# Patient Record
Sex: Male | Born: 1960 | Race: White | Hispanic: No | Marital: Married | Smoking: Never smoker
Health system: Southern US, Community
[De-identification: ages and names within clinical notes are randomized; demographics above are authoritative.]

## PROBLEM LIST (undated history)

## (undated) DIAGNOSIS — E119 Type 2 diabetes mellitus without complications: Secondary | ICD-10-CM

## (undated) DIAGNOSIS — Z8739 Personal history of other diseases of the musculoskeletal system and connective tissue: Secondary | ICD-10-CM

## (undated) DIAGNOSIS — T8859XA Other complications of anesthesia, initial encounter: Secondary | ICD-10-CM

## (undated) DIAGNOSIS — Z9889 Other specified postprocedural states: Secondary | ICD-10-CM

## (undated) DIAGNOSIS — I10 Essential (primary) hypertension: Secondary | ICD-10-CM

## (undated) DIAGNOSIS — T4145XA Adverse effect of unspecified anesthetic, initial encounter: Secondary | ICD-10-CM

## (undated) DIAGNOSIS — R112 Nausea with vomiting, unspecified: Secondary | ICD-10-CM

## (undated) HISTORY — PX: ACHILLES TENDON REPAIR: SUR1153

---

## 2012-05-06 ENCOUNTER — Other Ambulatory Visit (HOSPITAL_COMMUNITY): Payer: Self-pay | Admitting: Family Medicine

## 2012-05-06 ENCOUNTER — Ambulatory Visit (HOSPITAL_COMMUNITY)
Admission: RE | Admit: 2012-05-06 | Discharge: 2012-05-06 | Disposition: A | Discharge status: Home / Self Care | Payer: BC Managed Care – PPO | Source: Ambulatory Visit | Attending: Family Medicine | Admitting: Family Medicine

## 2012-05-06 DIAGNOSIS — R109 Unspecified abdominal pain: Secondary | ICD-10-CM | POA: Insufficient documentation

## 2012-05-06 DIAGNOSIS — R1032 Left lower quadrant pain: Secondary | ICD-10-CM

## 2014-02-25 IMAGING — CR DG ABDOMEN 1V
2 series · 2 of 2 positions shown · non-contrast
Comparison: None.

CLINICAL DATA: Abdominal pain for 1 week.

ABDOMEN - 1 VIEW

[view not recorded (1 of 2)]
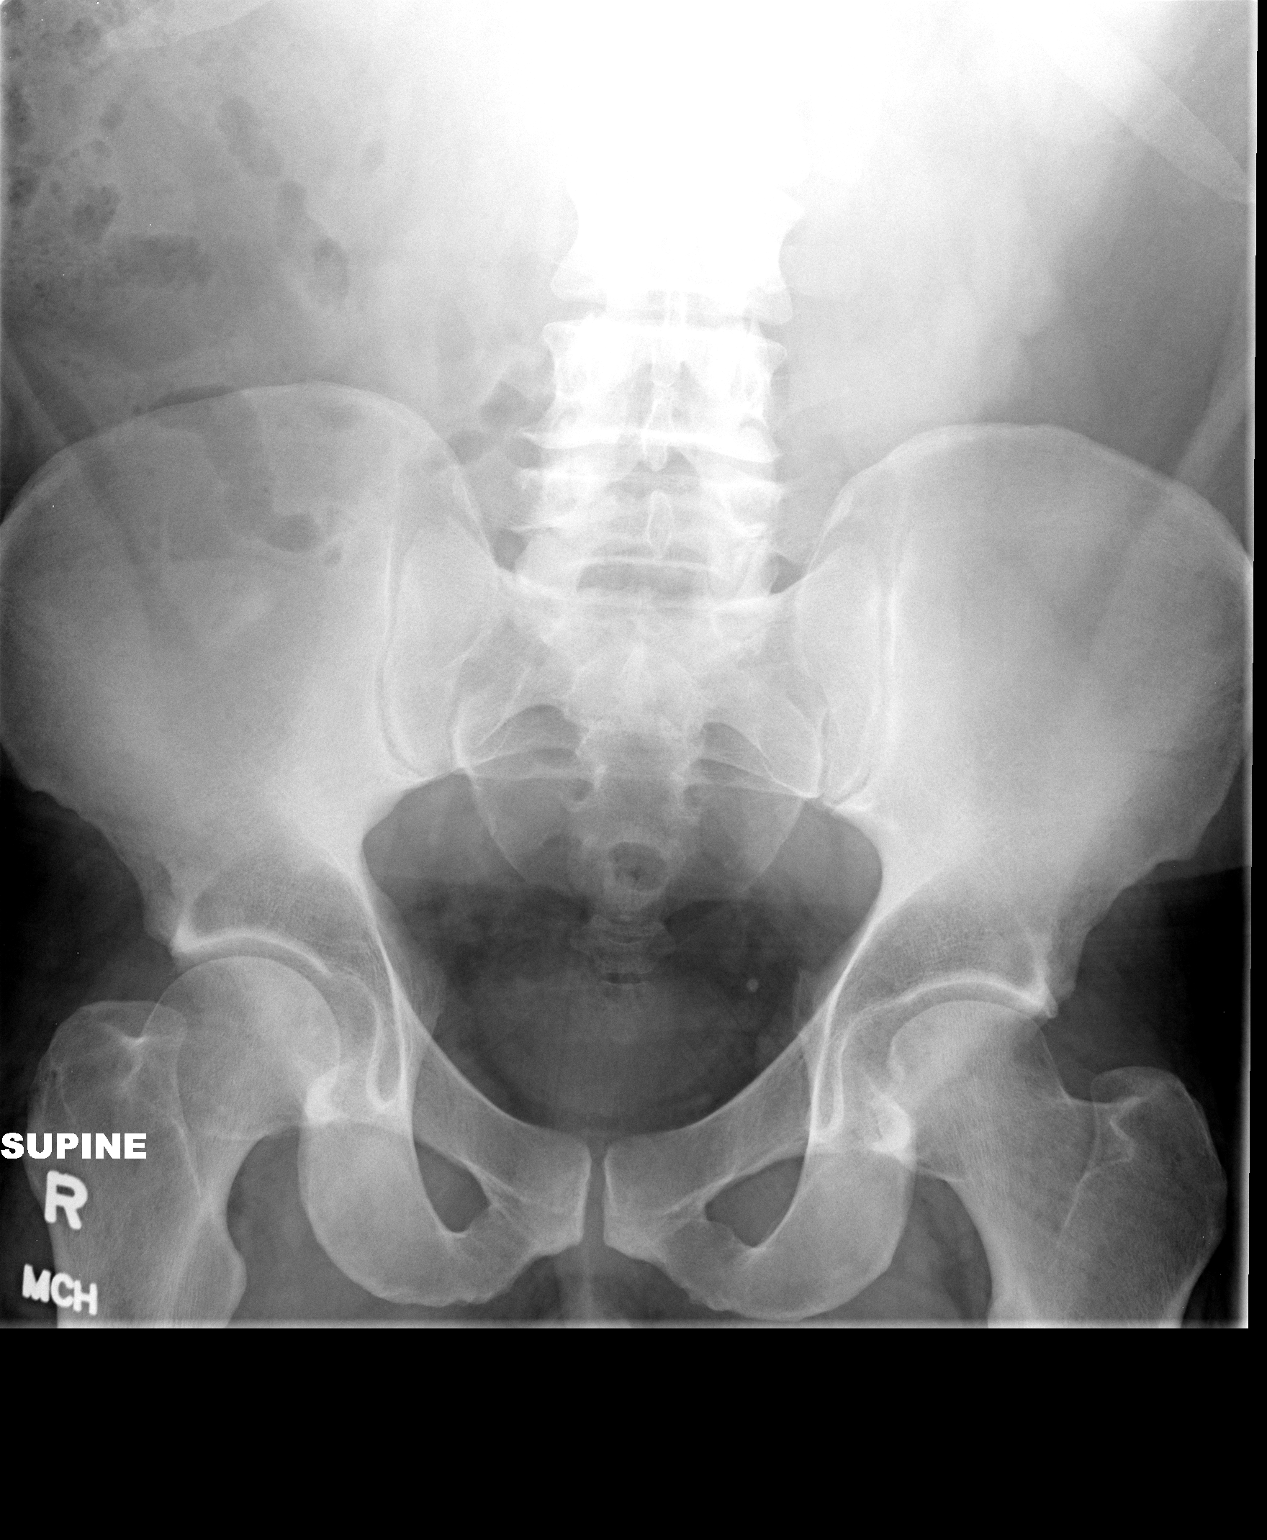

[view not recorded (2 of 2)]
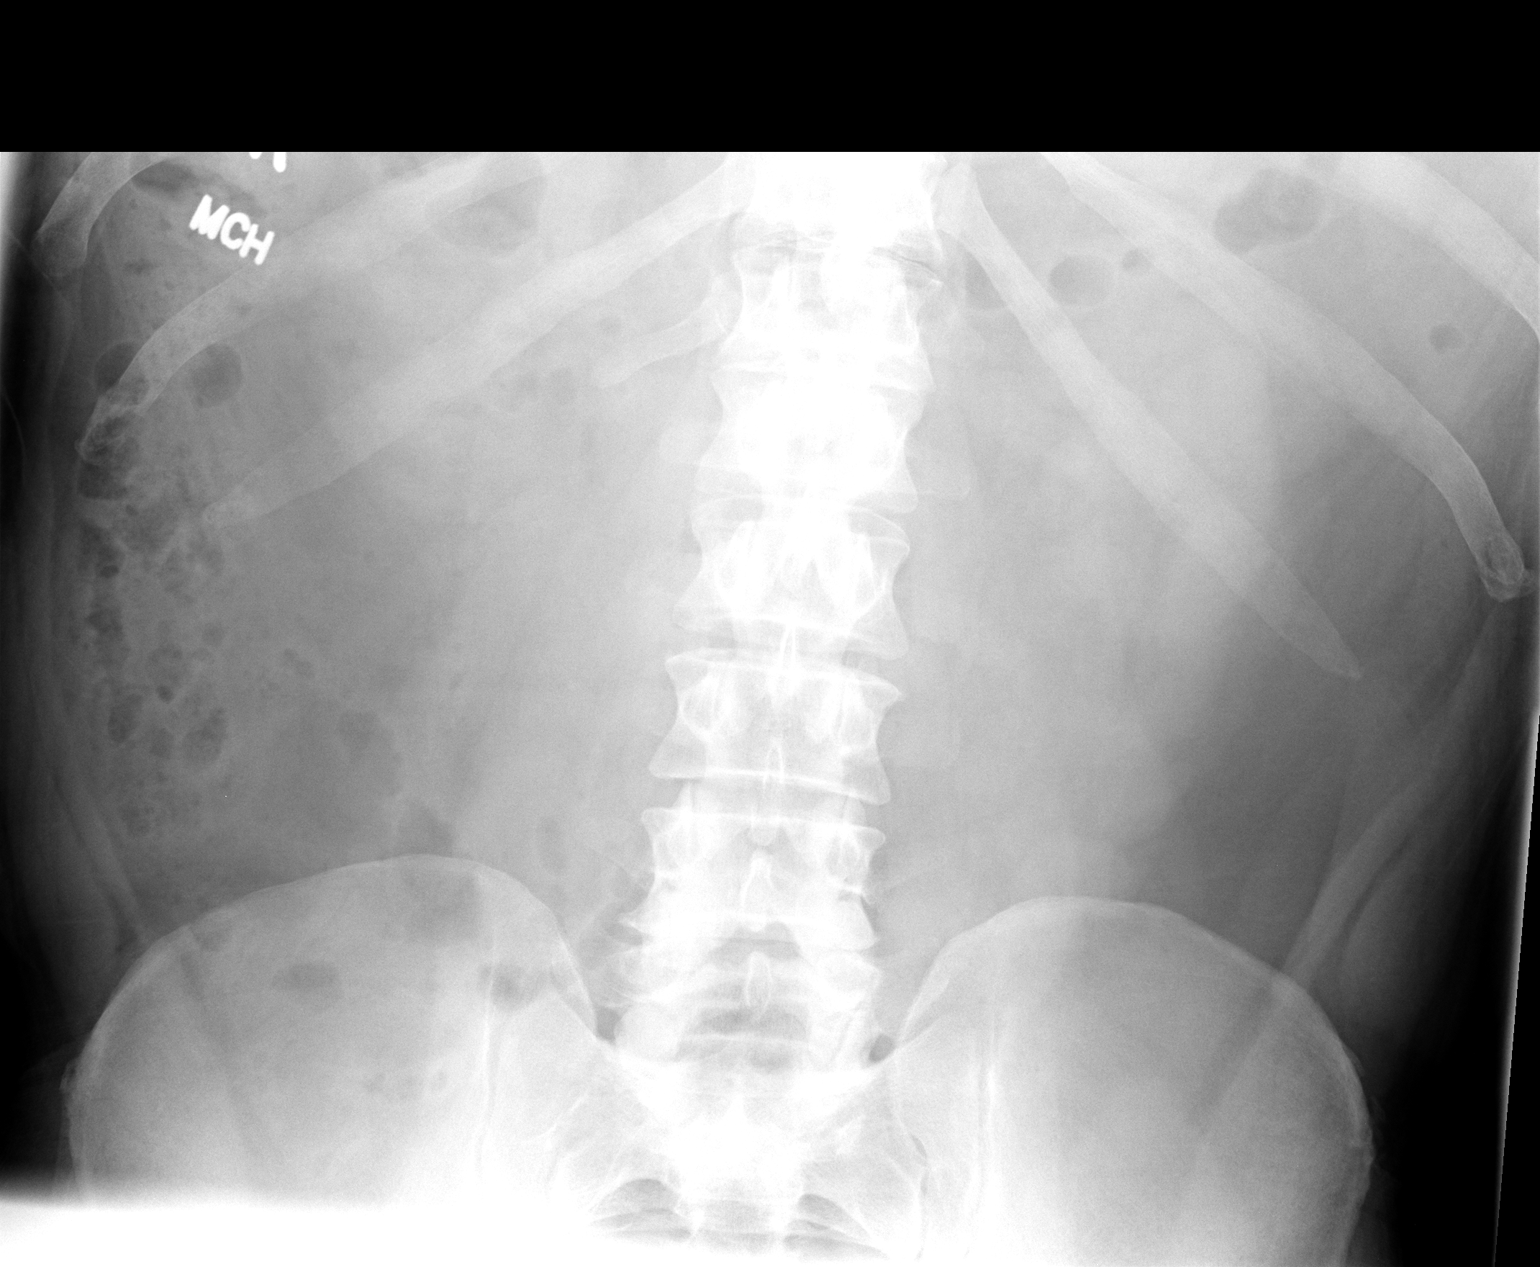

[2 of 2 positions shown; findings below may reference images not displayed]

FINDINGS: 2 supine views.  No gaseous distention of bowel loops.
Left hemipelvic probable phlebolith. No abnormal abdominal
calcifications.   No appendicolith.

No pneumatosis or free intraperitoneal air.
IMPRESSION: No acute findings.

## 2014-11-06 NOTE — H&P (Signed)
  NTS SOAP Note  Vital Signs:  Vitals as of: 10/29/2014: Systolic 176: Diastolic 94: Heart Rate 93: Temp 98.16F: Height 676ft 0in: Weight 261Lbs 0 Ounces: BMI 35.4  BMI : 35.4 kg/m2  Subjective: This 54 year old male presents for of need for screening TCS.  Never has had a colonoscopy.  Denies any gi complaints.  No family h/o colon cancer.  Review of Symptoms:  Constitutional:unremarkable   Head:unremarkable Eyes:unremarkable   Nose/Mouth/Throat:unremarkable Cardiovascular:  unremarkable Respiratory:unremarkable Gastrointestinal:  unremarkable   Genitourinary:unremarkable   Musculoskeletal:unremarkable Skin:unremarkable Hematolgic/Lymphatic:unremarkable   Allergic/Immunologic:unremarkable   Past Medical History:  Reviewed  Past Medical History  Surgical History: achilles tendon repair Medical Problems: HTN Allergies: nkda Medications: lisinopril   Social History:Reviewed  Social History  Preferred Language: English Race:  White Ethnicity: Not Hispanic / Latino Age: [age] year Marital Status:  M Alcohol: no   Smoking Status: Never smoker reviewed on 10/29/2014 Functional Status reviewed on 10/29/2014 ------------------------------------------------ Bathing: Normal Cooking: Normal Dressing: Normal Driving: Normal Eating: Normal Managing Meds: Normal Oral Care: Normal Shopping: Normal Toileting: Normal Transferring: Normal Walking: Normal Cognitive Status reviewed on 10/29/2014 ------------------------------------------------ Attention: Normal Decision Making: Normal Language: Normal Memory: Normal Motor: Normal Perception: Normal Problem Solving: Normal Visual and Spatial: Normal   Family History:Reviewed  Family Health History Mother, Living; Aortic valve stenosis;  Father, Living; Aortic valve stenosis;     Objective Information: General:Well appearing, well nourished in no distress. Heart:RRR, no murmur Lungs:   CTA bilaterally, no wheezes, rhonchi, rales.  Breathing unlabored. Abdomen:Soft, NT/ND, no HSM, no masses. deferred to procedure  Assessment:Need for screening TCS  Diagnoses: V76.51  Z12.11 Screening for malignant neoplasm of colon (Encounter for screening for malignant neoplasm of colon)  Procedures: [id number] - OFFICE OUTPATIENT NEW 20 MINUTES    Plan:  scheduled for screening TCS on 12/04/14.   Patient Education:Alternative treatments to surgery were discussed with patient (and family).  Risks and benefits  of procedure including bleeding and perforation were fully explained to the patient (and family) who gave informed consent. Patient/family questions were addressed.  Follow-up:Pending Surgery

## 2014-12-04 ENCOUNTER — Encounter (HOSPITAL_COMMUNITY): Payer: Self-pay

## 2014-12-04 ENCOUNTER — Encounter (HOSPITAL_COMMUNITY)
Admission: RE | Disposition: A | Discharge status: Home / Self Care | Payer: Self-pay | Source: Ambulatory Visit | Attending: General Surgery

## 2014-12-04 ENCOUNTER — Ambulatory Visit (HOSPITAL_COMMUNITY)
Admission: RE | Admit: 2014-12-04 | Discharge: 2014-12-04 | Disposition: A | Discharge status: Home / Self Care | Payer: BC Managed Care – PPO | Source: Ambulatory Visit | Attending: General Surgery | Admitting: General Surgery

## 2014-12-04 DIAGNOSIS — I10 Essential (primary) hypertension: Secondary | ICD-10-CM | POA: Diagnosis not present

## 2014-12-04 DIAGNOSIS — Z79899 Other long term (current) drug therapy: Secondary | ICD-10-CM | POA: Insufficient documentation

## 2014-12-04 DIAGNOSIS — Z1211 Encounter for screening for malignant neoplasm of colon: Secondary | ICD-10-CM | POA: Diagnosis present

## 2014-12-04 HISTORY — DX: Essential (primary) hypertension: I10

## 2014-12-04 HISTORY — DX: Other specified postprocedural states: Z98.890

## 2014-12-04 HISTORY — DX: Nausea with vomiting, unspecified: R11.2

## 2014-12-04 HISTORY — DX: Other complications of anesthesia, initial encounter: T88.59XA

## 2014-12-04 HISTORY — DX: Personal history of other diseases of the musculoskeletal system and connective tissue: Z87.39

## 2014-12-04 HISTORY — DX: Adverse effect of unspecified anesthetic, initial encounter: T41.45XA

## 2014-12-04 HISTORY — PX: COLONOSCOPY: SHX5424

## 2014-12-04 SURGERY — COLONOSCOPY
Anesthesia: Moderate Sedation

## 2014-12-04 MED ORDER — ONDANSETRON HCL 4 MG/2ML IJ SOLN
INTRAMUSCULAR | Status: AC
  Filled 2014-12-04: qty 2

## 2014-12-04 MED ORDER — MEPERIDINE HCL 50 MG/ML IJ SOLN
INTRAMUSCULAR | Status: DC | PRN
  Administered 2014-12-04: 50 mg via INTRAVENOUS

## 2014-12-04 MED ORDER — MIDAZOLAM HCL 5 MG/5ML IJ SOLN
INTRAMUSCULAR | Status: AC
  Filled 2014-12-04: qty 10

## 2014-12-04 MED ORDER — MIDAZOLAM HCL 5 MG/5ML IJ SOLN
INTRAMUSCULAR | Status: DC | PRN
  Administered 2014-12-04: 3 mg via INTRAVENOUS
  Administered 2014-12-04 (×2): 1 mg via INTRAVENOUS

## 2014-12-04 MED ORDER — MEPERIDINE HCL 50 MG/ML IJ SOLN
INTRAMUSCULAR | Status: AC
  Filled 2014-12-04: qty 1

## 2014-12-04 MED ORDER — STERILE WATER FOR IRRIGATION IR SOLN
Status: DC | PRN
  Administered 2014-12-04: 08:00:00

## 2014-12-04 MED ORDER — ONDANSETRON HCL 4 MG/2ML IJ SOLN
4.0000 mg | Freq: Once | INTRAMUSCULAR | Status: AC
  Administered 2014-12-04: 4 mg via INTRAVENOUS

## 2014-12-04 MED ORDER — SODIUM CHLORIDE 0.9 % IV SOLN
INTRAVENOUS | Status: DC
  Administered 2014-12-04: 07:00:00 via INTRAVENOUS

## 2014-12-04 NOTE — Discharge Instructions (Signed)

## 2014-12-04 NOTE — Interval H&P Note (Signed)
History and Physical Interval Note:  12/04/2014 7:24 AM  Hilda BladesJames R Closson  has presented today for surgery, with the diagnosis of screening  The various methods of treatment have been discussed with the patient and family. After consideration of risks, benefits and other options for treatment, the patient has consented to  Procedure(s): COLONOSCOPY (N/A) as a surgical intervention .  The patient's history has been reviewed, patient examined, no change in status, stable for surgery.  I have reviewed the patient's chart and labs.  Questions were answered to the patient's satisfaction.     Franky MachoJENKINS,Cythnia Osmun A

## 2014-12-04 NOTE — Op Note (Signed)
[facility] [address] [place], [id number]   COLONOSCOPY PROCEDURE REPORT     EXAM DATE: 12/04/2014  PATIENT NAME:      Curtis Sanchez, Curtis Sanchez           MR #:      [id number]  BIRTHDATE:       [birth year]      VISIT #:     [id number]  ATTENDING:     Franky MachoMark Olina Melfi, MD     STATUS:     outpatient ASSISTANT:  INDICATIONS:  The patient is a 54 yr old male here for a colonoscopy due to average risk patient for colon cancer. PROCEDURE PERFORMED:     Colonoscopy, screening MEDICATIONS:     Demerol 50 mg IV, Zofran 4mg  IV , and Versed 5 mg IV ESTIMATED BLOOD LOSS:     None  CONSENT: The patient understands the risks and benefits of the procedure and understands that these risks include, but are not limited to: sedation, allergic reaction, infection, perforation and/or bleeding. Alternative means of evaluation and treatment include, among others: physical exam, x-rays, and/or surgical intervention. The patient elects to proceed with this endoscopic procedure.  DESCRIPTION OF PROCEDURE: During intra-op preparation period all mechanical & medical equipment was checked for proper function. Hand hygiene and appropriate measures for infection prevention was taken. After the risks, benefits and alternatives of the procedure were thoroughly explained, Informed consent was verified, confirmed and timeout was successfully executed by the treatment team. A digital exam revealed no abnormalities of the rectum. The EC-[id number] ([id number] endoscope was introduced through the anus and advanced to the cecum, which was identified by both the appendix and ileocecal valve. adequate (Trilyte was used) The instrument was then slowly withdrawn as the colon was fully examined.Estimated blood loss is zero unless otherwise noted in this procedure report.   COLON FINDINGS: A normal appearing cecum, ileocecal valve, and appendiceal orifice were identified.  The ascending,  transverse, descending, sigmoid colon, and rectum appeared unremarkable. Retroflexed views revealed no abnormalities. The scope was then completely withdrawn from the patient and the procedure terminated.  SCOPE WITHDRAWAL TIME: 6    ADVERSE EVENTS:      There were no immediate complications.  IMPRESSIONS:     Normal colonoscopy  RECOMMENDATIONS:     Repeat Colonscopy in 10 years. RECALL:  _____________________________ Curtis MachoMark Kecia Swoboda, MD eSigned:  Franky MachoMark Boomer Winders, MD 12/04/2014 7:50 AM   cc:   CPT CODES: ICD CODES:  The ICD and CPT codes recommended by this software are interpretations from the data that the clinical staff has captured with the software.  The verification of the translation of this report to the ICD and CPT codes and modifiers is the sole responsibility of the health care institution and practicing physician where this report was generated.  [employer] will not be held responsible for the validity of the ICD and CPT codes included on this report.  AMA assumes no liability for data contained or not contained herein. CPT is a Publishing rights managerregistered trademark of the [employer].

## 2014-12-07 ENCOUNTER — Encounter (HOSPITAL_COMMUNITY): Payer: Self-pay | Admitting: General Surgery

## 2022-03-27 ENCOUNTER — Ambulatory Visit: Payer: Self-pay | Admitting: Orthopedic Surgery

## 2022-03-27 DIAGNOSIS — M48062 Spinal stenosis, lumbar region with neurogenic claudication: Secondary | ICD-10-CM

## 2022-03-29 ENCOUNTER — Ambulatory Visit: Payer: Self-pay | Admitting: Orthopedic Surgery

## 2022-03-29 NOTE — H&P (Signed)
Curtis Sanchez is an 61 y.o. male.   Chief Complaint: back and left leg pain, numbness HPI: Reason for Visit: (normal) visit for: (back); 12 weeks Location (Lower Extremity): leg pain on the left, , Severity: pain level 5/10 Timing: constant Quality: aching; continuous Associated Symptoms: numbness/tingling; no change in bowel/bladder habits; cramps Medications: Patient was prescribed Oxycodone and Gabapentin, Meloxicam Notes: 02/23/22 T-F ESI L L4-5 Patient is a no significant help from his epidural steroid injection perhaps a day or so. He is unable to stand or sit for long periods of time and continually radiates down into his leg. He has been out of work.  Past Medical History:  Diagnosis Date   Complication of anesthesia    History of gout    Hypertension    PONV (postoperative nausea and vomiting)     Past Surgical History:  Procedure Laterality Date   ACHILLES TENDON REPAIR Left    3/97   COLONOSCOPY N/A 12/04/2014   Procedure: COLONOSCOPY;  Surgeon: Franky Macho Md, MD;  Location: [facility];  Service: Gastroenterology;  Laterality: N/A;    No family history on file. Social History:  reports that he has never smoked. He does not have any smokeless tobacco history on file. He reports that he does not drink alcohol and does not use drugs.  Allergies: No Known Allergies  Current meds: aspirin Januvia 100 mg tablet levothyroxine  metFORMIN 1,000 mg tablet Mobic 15 mg tablet Mounjaro 7.5 mg/0.5 mL subcutaneous pen injector Neurontin 300 mg capsule olmesartan 40 mg tablet oxyCODONE-acetaminophen 10 mg-325 mg tablet predniSONE 5 mg tablets in a dose pack rosuvastatin 20 mg tablet  Review of Systems  Constitutional: Negative.   HENT: Negative.    Eyes: Negative.   Respiratory: Negative.    Cardiovascular: Negative.   Gastrointestinal: Negative.   Endocrine: Negative.   Genitourinary: Negative.   Musculoskeletal:  Positive for back pain, gait problem and  myalgias.  Skin: Negative.   Neurological:  Positive for weakness and numbness.  Psychiatric/Behavioral: Negative.      There were no vitals taken for this visit. Physical Exam Constitutional:      Appearance: Normal appearance.  HENT:     Head: Normocephalic and atraumatic.     Right Ear: External ear normal.     Left Ear: External ear normal.     Nose: Nose normal.     Mouth/Throat:     Pharynx: Oropharynx is clear.  Eyes:     Conjunctiva/sclera: Conjunctivae normal.  Cardiovascular:     Rate and Rhythm: Normal rate and regular rhythm.     Pulses: Normal pulses.     Heart sounds: Normal heart sounds.  Pulmonary:     Effort: Pulmonary effort is normal.     Breath sounds: Normal breath sounds.  Abdominal:     General: Bowel sounds are normal.  Musculoskeletal:     Cervical back: Normal range of motion.     Comments: Gait and Station: Appearance: ambulating with no assistive devices and antalgic gait.  Constitutional: General Appearance: healthy-appearing and distress (mild).  Psychiatric: Mood and Affect: active and alert.  Cardiovascular System: Edema Right: none; Dorsalis and posterior tibial pulses 2+. Edema Left: none.  Abdomen: Inspection and Palpation: non-distended and no tenderness.  Skin: Inspection and palpation: no rash.  Lumbar Spine: Inspection: normal alignment. Bony Palpation of the Lumbar Spine: tender at lumbosacral junction.. Bony Palpation of the Right Hip: no tenderness of the greater trochanter and tenderness of the SI joint; Pelvis  stable. Bony Palpation of the Left Hip: no tenderness of the greater trochanter and tenderness of the SI joint. Soft Tissue Palpation on the Right: No flank pain with percussion. Active Range of Motion: limited flexion and extention.  Motor Strength: L1 Motor Strength on the Right: hip flexion iliopsoas 5/5. L1 Motor Strength on the Left: hip flexion iliopsoas 5/5. L2-L4 Motor Strength on the Right: knee extension  quadriceps 5/5. L2-L4 Motor Strength on the Left: knee extension quadriceps 5/5. L5 Motor Strength on the Right: ankle dorsiflexion tibialis anterior 5/5 and great toe extension extensor hallucis longus 5/5. L5 Motor Strength on the Left: ankle dorsiflexion tibialis anterior 5/5 and great toe extension extensor hallucis longus 5/5. S1 Motor Strength on the Right: plantar flexion gastrocnemius 5/5. S1 Motor Strength on the Left: plantar flexion gastrocnemius 5/5.  Neurological System: Knee Reflex Right: normal (2). Knee Reflex Left: normal (2). Ankle Reflex Right: normal (2). Ankle Reflex Left: normal (2). Babinski Reflex Right: plantar reflex absent. Babinski Reflex Left: plantar reflex absent. Sensation on the Right: normal distal extremities. Sensation on the Left: normal distal extremities. Special Tests on the Right: no clonus of the ankle/knee. Special Tests on the Left: no clonus of the ankle/knee and seated straight leg raising test positive.  Trace dorsiflexion weakness is noted on the left compared to the right  Skin:    General: Skin is warm and dry.  Neurological:     Mental Status: He is alert.    X-rays lumbar spine demonstrates multilevel mild disc degeneration and spondylosis. A minimal offset at L4-5 without instability in flexion extension.  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 Displacing the L5 nerve root. Severe facet arthrosis predominate on the right at L4-5. Congenitally short pedicles.  Assessment/Plan Impression:  1. 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 Slight dorsiflexion weakness. Failure to improve with epidural steroid injection activity modification home exercise program. 2. Asymptomatic disc protrusion T12-L1 to the left. 3. Multilevel lumbar spondylosis. Without back pain  Plan:  Given the patient has not improved with conservative treatment and he has been unable to return  to work due to his pain we discussed lumbar decompression L4-5 to the left with removal of the synovial cyst. I had an extensive discussion with the patient concerning the pathology relevant anatomy and treatment options. At this point exhausting conservative treatment and in the presence of a neurologic deficit we discussed microlumbar decompression. I discussed the risks and benefits including bleeding, infection, DVT, PE, anesthetic complications, worsening in their symptoms, improvement in their symptoms, C SF leakage, epidural fibrosis, need for future surgeries such as revision discectomy and lumbar fusion. I also indicated that this is an operation to basically decompress the nerve roots to allow recovery as opposed to fixing a herniated disc if it is encountered and that the incidence of recurrent chest disc herniation can approach 15%. Also that nerve root recovery is variable and may not recover completely. Any ligament or bone that is contributing to compressing the nerves will be removed as well. I discussed the possible recurrence of his synovial cyst that in the future may require facet joint removal and interbody fusion.  I discussed the operative course including overnight in the hospital. Immediate ambulation. Follow-up in 2 weeks for suture removal. 6 weeks until healing of the herniation and surgical incision followed by 6 weeks of reconditioning and strengthening of the core musculature. Also discussed  the need to employ the concepts of disc pressure management and core motion following the surgery to minimize the risk of recurrent disc herniation. We will obtain preoperative clearance i if necessary and proceed accordingly.   Patient is to call or present to the emergency room if there is any numbness or weakness to suggest worsening of their condition. In addition although rare if there is loss of bowel or bladder function such as incontinence or inability to void that this may represent  a cauda equina syndrome. If noted the patient is to present immediately to the emergency room for evaluation and treatment otherwise permanent bowel or bladder dysfunction can occur as a result.  Patient was given a prescription for gabapentin to be taken as directed previously. It is to be titrated for effect up to 3 times a day for neuropathic pain as needed. Starting with 1 a day for 3 days, then 2 a day for 3 days, then 3 times a day as needed. It is frequent that during the day the side effects are not tolerated and therefore taken only at night. Symptoms such as dizziness and being groggy. Occasionally there is fluid retention and weight gain associated with this medication. When the pain subsides and the medication is no longer needed it should be discontinued by slowly weaning off the medication. If taking it 3 times a day then it should be decreased to 2 times a day for a week and then 1 time a day for a week and then as needed.  A prescription for an opioid was given to be taken as directed for pain control. I discussed the risks including cognitive changes which may affect the ability to operate machinery and to drive. In addition the side effect of constipation as well as to avoid taking with conflicting medications that were discussed. The patient's prescription drug monitoring report was reviewed with no red flags noted.  Patient was given a prescription for an anti-inflammatory or instructed to take over-the-counter anti-inflammatory medications. Side effects were discussed including potential elevation of blood pressure, long-term effect on the kidneys and liver, ulcer risks and therefore the need for appropriate monitoring if taken continually. That would include regular blood chemistries by a primary care physician to evaluate kidney function as well as liver function and monitor for potential gastrointestinal effects. My preference is to utilize anti-inflammatories periodically and  preemptively to reduce inflammation on a short-term basis. This would decrease the risks associated with long-term use of those medications. In addition anti-inflammatory medications should be taken with a meal. They should not be taken if a blood thinner is being used or the patient has a history of peptic ulcer disease.  Preoperative clearance.  Patient will be out of work in the interim as she is unable to perform the physical capabilities of doing so. Approximately 3 months until he is at [facility]  Plan hemilaminotomy and excision of synovial cyst L4-5 left  Napoleon Monacelli M Carrera Kiesel, PA-C for Dr Beane 03/29/2022, 1:02 PM    

## 2022-03-29 NOTE — H&P (View-Only) (Signed)
Curtis Sanchez is an 61 y.o. male.   Chief Complaint: back and left leg pain, numbness HPI: Reason for Visit: (normal) visit for: (back); 12 weeks Location (Lower Extremity): leg pain on the left, , Severity: pain level 5/10 Timing: constant Quality: aching; continuous Associated Symptoms: numbness/tingling; no change in bowel/bladder habits; cramps Medications: Patient was prescribed Oxycodone and Gabapentin, Meloxicam Notes: 02/23/22 T-F ESI L L4-5 Patient is a no significant help from his epidural steroid injection perhaps a day or so. He is unable to stand or sit for long periods of time and continually radiates down into his leg. He has been out of work.  Past Medical History:  Diagnosis Date   Complication of anesthesia    History of gout    Hypertension    PONV (postoperative nausea and vomiting)     Past Surgical History:  Procedure Laterality Date   ACHILLES TENDON REPAIR Left    3/97   COLONOSCOPY N/A 12/04/2014   Procedure: COLONOSCOPY;  Surgeon: Franky Macho Md, MD;  Location: [facility];  Service: Gastroenterology;  Laterality: N/A;    No family history on file. Social History:  reports that he has never smoked. He does not have any smokeless tobacco history on file. He reports that he does not drink alcohol and does not use drugs.  Allergies: No Known Allergies  Current meds: aspirin Januvia 100 mg tablet levothyroxine  metFORMIN 1,000 mg tablet Mobic 15 mg tablet Mounjaro 7.5 mg/0.5 mL subcutaneous pen injector Neurontin 300 mg capsule olmesartan 40 mg tablet oxyCODONE-acetaminophen 10 mg-325 mg tablet predniSONE 5 mg tablets in a dose pack rosuvastatin 20 mg tablet  Review of Systems  Constitutional: Negative.   HENT: Negative.    Eyes: Negative.   Respiratory: Negative.    Cardiovascular: Negative.   Gastrointestinal: Negative.   Endocrine: Negative.   Genitourinary: Negative.   Musculoskeletal:  Positive for back pain, gait problem and  myalgias.  Skin: Negative.   Neurological:  Positive for weakness and numbness.  Psychiatric/Behavioral: Negative.      There were no vitals taken for this visit. Physical Exam Constitutional:      Appearance: Normal appearance.  HENT:     Head: Normocephalic and atraumatic.     Right Ear: External ear normal.     Left Ear: External ear normal.     Nose: Nose normal.     Mouth/Throat:     Pharynx: Oropharynx is clear.  Eyes:     Conjunctiva/sclera: Conjunctivae normal.  Cardiovascular:     Rate and Rhythm: Normal rate and regular rhythm.     Pulses: Normal pulses.     Heart sounds: Normal heart sounds.  Pulmonary:     Effort: Pulmonary effort is normal.     Breath sounds: Normal breath sounds.  Abdominal:     General: Bowel sounds are normal.  Musculoskeletal:     Cervical back: Normal range of motion.     Comments: Gait and Station: Appearance: ambulating with no assistive devices and antalgic gait.  Constitutional: General Appearance: healthy-appearing and distress (mild).  Psychiatric: Mood and Affect: active and alert.  Cardiovascular System: Edema Right: none; Dorsalis and posterior tibial pulses 2+. Edema Left: none.  Abdomen: Inspection and Palpation: non-distended and no tenderness.  Skin: Inspection and palpation: no rash.  Lumbar Spine: Inspection: normal alignment. Bony Palpation of the Lumbar Spine: tender at lumbosacral junction.. Bony Palpation of the Right Hip: no tenderness of the greater trochanter and tenderness of the SI joint; Pelvis  stable. Bony Palpation of the Left Hip: no tenderness of the greater trochanter and tenderness of the SI joint. Soft Tissue Palpation on the Right: No flank pain with percussion. Active Range of Motion: limited flexion and extention.  Motor Strength: L1 Motor Strength on the Right: hip flexion iliopsoas 5/5. L1 Motor Strength on the Left: hip flexion iliopsoas 5/5. L2-L4 Motor Strength on the Right: knee extension  quadriceps 5/5. L2-L4 Motor Strength on the Left: knee extension quadriceps 5/5. L5 Motor Strength on the Right: ankle dorsiflexion tibialis anterior 5/5 and great toe extension extensor hallucis longus 5/5. L5 Motor Strength on the Left: ankle dorsiflexion tibialis anterior 5/5 and great toe extension extensor hallucis longus 5/5. S1 Motor Strength on the Right: plantar flexion gastrocnemius 5/5. S1 Motor Strength on the Left: plantar flexion gastrocnemius 5/5.  Neurological System: Knee Reflex Right: normal (2). Knee Reflex Left: normal (2). Ankle Reflex Right: normal (2). Ankle Reflex Left: normal (2). Babinski Reflex Right: plantar reflex absent. Babinski Reflex Left: plantar reflex absent. Sensation on the Right: normal distal extremities. Sensation on the Left: normal distal extremities. Special Tests on the Right: no clonus of the ankle/knee. Special Tests on the Left: no clonus of the ankle/knee and seated straight leg raising test positive.  Trace dorsiflexion weakness is noted on the left compared to the right  Skin:    General: Skin is warm and dry.  Neurological:     Mental Status: He is alert.    X-rays lumbar spine demonstrates multilevel mild disc degeneration and spondylosis. A minimal offset at L4-5 without instability in flexion extension.  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 Displacing the L5 nerve root. Severe facet arthrosis predominate on the right at L4-5. Congenitally short pedicles.  Assessment/Plan Impression:  1. 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 Slight dorsiflexion weakness. Failure to improve with epidural steroid injection activity modification home exercise program. 2. Asymptomatic disc protrusion T12-L1 to the left. 3. Multilevel lumbar spondylosis. Without back pain  Plan:  Given the patient has not improved with conservative treatment and he has been unable to return  to work due to his pain we discussed lumbar decompression L4-5 to the left with removal of the synovial cyst. I had an extensive discussion with the patient concerning the pathology relevant anatomy and treatment options. At this point exhausting conservative treatment and in the presence of a neurologic deficit we discussed microlumbar decompression. I discussed the risks and benefits including bleeding, infection, DVT, PE, anesthetic complications, worsening in their symptoms, improvement in their symptoms, C SF leakage, epidural fibrosis, need for future surgeries such as revision discectomy and lumbar fusion. I also indicated that this is an operation to basically decompress the nerve roots to allow recovery as opposed to fixing a herniated disc if it is encountered and that the incidence of recurrent chest disc herniation can approach 15%. Also that nerve root recovery is variable and may not recover completely. Any ligament or bone that is contributing to compressing the nerves will be removed as well. I discussed the possible recurrence of his synovial cyst that in the future may require facet joint removal and interbody fusion.  I discussed the operative course including overnight in the hospital. Immediate ambulation. Follow-up in 2 weeks for suture removal. 6 weeks until healing of the herniation and surgical incision followed by 6 weeks of reconditioning and strengthening of the core musculature. Also discussed  the need to employ the concepts of disc pressure management and core motion following the surgery to minimize the risk of recurrent disc herniation. We will obtain preoperative clearance i if necessary and proceed accordingly.   Patient is to call or present to the emergency room if there is any numbness or weakness to suggest worsening of their condition. In addition although rare if there is loss of bowel or bladder function such as incontinence or inability to void that this may represent  a cauda equina syndrome. If noted the patient is to present immediately to the emergency room for evaluation and treatment otherwise permanent bowel or bladder dysfunction can occur as a result.  Patient was given a prescription for gabapentin to be taken as directed previously. It is to be titrated for effect up to 3 times a day for neuropathic pain as needed. Starting with 1 a day for 3 days, then 2 a day for 3 days, then 3 times a day as needed. It is frequent that during the day the side effects are not tolerated and therefore taken only at night. Symptoms such as dizziness and being groggy. Occasionally there is fluid retention and weight gain associated with this medication. When the pain subsides and the medication is no longer needed it should be discontinued by slowly weaning off the medication. If taking it 3 times a day then it should be decreased to 2 times a day for a week and then 1 time a day for a week and then as needed.  A prescription for an opioid was given to be taken as directed for pain control. I discussed the risks including cognitive changes which may affect the ability to operate machinery and to drive. In addition the side effect of constipation as well as to avoid taking with conflicting medications that were discussed. The patient's prescription drug monitoring report was reviewed with no red flags noted.  Patient was given a prescription for an anti-inflammatory or instructed to take over-the-counter anti-inflammatory medications. Side effects were discussed including potential elevation of blood pressure, long-term effect on the kidneys and liver, ulcer risks and therefore the need for appropriate monitoring if taken continually. That would include regular blood chemistries by a primary care physician to evaluate kidney function as well as liver function and monitor for potential gastrointestinal effects. My preference is to utilize anti-inflammatories periodically and  preemptively to reduce inflammation on a short-term basis. This would decrease the risks associated with long-term use of those medications. In addition anti-inflammatory medications should be taken with a meal. They should not be taken if a blood thinner is being used or the patient has a history of peptic ulcer disease.  Preoperative clearance.  Patient will be out of work in the interim as she is unable to perform the physical capabilities of doing so. Approximately 3 months until he is at [facility]  Plan hemilaminotomy and excision of synovial cyst L4-5 left  Cecilie Kicks, PA-C for Dr Tonita Cong 03/29/2022, 1:02 PM

## 2022-04-06 NOTE — Pre-Procedure Instructions (Signed)
Surgical Instructions    Your procedure is scheduled on Monday September 18.  Report to [facility] Main Entrance "A" at 10:45 A.M., then check in with the Admitting office.  Call this number if you have problems the morning of surgery:  [phone]   If you have any questions prior to your surgery date call [phone]: Open Monday-Friday 8am-4pm    Remember:  Do not eat after midnight the night before your surgery  You may drink clear liquids until 9:45 the morning of your surgery.   Clear liquids allowed are: Water, Non-Citrus Juices (without pulp), Carbonated Beverages, Clear Tea, Black Coffee ONLY (NO MILK, CREAM OR POWDERED CREAMER of any kind), and Gatorade    Take these medicines the morning of surgery with A SIP OF WATER:  levothyroxine (SYNTHROID) oxyCODONE-acetaminophen (PERCOCET) rosuvastatin (CRESTOR)    IF NEEDED: acetaminophen (TYLENOL) gabapentin (NEURONTIN)  WHAT DO I DO ABOUT MY DIABETES MEDICATION?  STOP OZEMPIC 1 week prior to surgery. Last dose by Monday September 11.  Do not take JANUVIA or metFORMIN (GLUCOPHAGE)  the morning of surgery.   HOW TO MANAGE YOUR DIABETES BEFORE AND AFTER SURGERY  Why is it important to control my blood sugar before and after surgery? Improving blood sugar levels before and after surgery helps healing and can limit problems. A way of improving blood sugar control is eating a healthy diet by:  Eating less sugar and carbohydrates  Increasing activity/exercise  Talking with your doctor about reaching your blood sugar goals High blood sugars (greater than 180 mg/dL) can raise your risk of infections and slow your recovery, so you will need to focus on controlling your diabetes during the weeks before surgery. Make sure that the doctor who takes care of your diabetes knows about your planned surgery including the date and location.  How do I manage my blood sugar before surgery? Check your blood sugar at least 4 times a  day, starting 2 days before surgery, to make sure that the level is not too high or low.  Check your blood sugar the morning of your surgery when you wake up and every 2 hours until you get to the Short Stay unit.  If your blood sugar is less than 70 mg/dL, you will need to treat for low blood sugar: Do not take insulin. Treat a low blood sugar (less than 70 mg/dL) with  cup of clear juice (cranberry or apple), 4 glucose tablets, OR glucose gel. Recheck blood sugar in 15 minutes after treatment (to make sure it is greater than 70 mg/dL). If your blood sugar is not greater than 70 mg/dL on recheck, call [phone] for further instructions. Report your blood sugar to the short stay nurse when you get to Short Stay.  If you are admitted to the hospital after surgery: Your blood sugar will be checked by the staff and you will probably be given insulin after surgery (instead of oral diabetes medicines) to make sure you have good blood sugar levels. The goal for blood sugar control after surgery is 80-180 mg/dL.  As of today, STOP taking any Aspirin (unless otherwise instructed by your surgeon) Aleve, Naproxen, Ibuprofen, Motrin, Advil, Goody's, BC's, all herbal medications, fish oil, and all vitamins. THIS INCLUDES meloxicam (MOBIC) and Celexa.           Do not wear jewelry or makeup. Do not wear lotions, powders, cologne or deodorant. Men may shave face and neck. Do not bring valuables to the hospital. [facility] is not  responsible for any belongings or valuables.    Do NOT Smoke (Tobacco/Vaping)  24 hours prior to your procedure  If you use a CPAP at night, you may bring your mask for your overnight stay.   Contacts, glasses, hearing aids, dentures or partials may not be worn into surgery, please bring cases for these belongings   For patients admitted to the hospital, discharge time will be determined by your treatment team.   Patients discharged the day of surgery will not be allowed  to drive home, and someone needs to stay with them for 24 hours.   SURGICAL WAITING ROOM VISITATION Patients having surgery or a procedure may have no more than 2 support people in the waiting area - these visitors may rotate.   Children under the age of 11 must have an adult with them who is not the patient. If the patient needs to stay at the hospital during part of their recovery, the visitor guidelines for inpatient rooms apply. Pre-op nurse will coordinate an appropriate time for 1 support person to accompany patient in pre-op.  This support person may not rotate.   Please refer to the [facility] website for the visitor guidelines for Inpatients (after your surgery is over and you are in a regular room).    Special instructions:    Oral Hygiene is also important to reduce your risk of infection.  Remember - BRUSH YOUR TEETH THE MORNING OF SURGERY WITH YOUR REGULAR TOOTHPASTE   Curtis Sanchez- Preparing For Surgery  Before surgery, you can play an important role. Because skin is not sterile, your skin needs to be as free of germs as possible. You can reduce the number of germs on your skin by washing with CHG (chlorahexidine gluconate) Soap before surgery.  CHG is an antiseptic cleaner which kills germs and bonds with the skin to continue killing germs even after washing.     Please do not use if you have an allergy to CHG or antibacterial soaps. If your skin becomes reddened/irritated stop using the CHG.  Do not shave (including legs and underarms) for at least 48 hours prior to first CHG shower. It is OK to shave your face.  Please follow these instructions carefully.     Shower the NIGHT BEFORE SURGERY and the MORNING OF SURGERY with CHG Soap.   If you chose to wash your hair, wash your hair first as usual with your normal shampoo. After you shampoo, rinse your hair and body thoroughly to remove the shampoo.  Then [employer] and genitals (private parts) with your normal soap and rinse  thoroughly to remove soap.  After that Use CHG Soap as you would any other liquid soap. You can apply CHG directly to the skin and wash gently with a scrungie or a clean washcloth.   Apply the CHG Soap to your body ONLY FROM THE NECK DOWN.  Do not use on open wounds or open sores. Avoid contact with your eyes, ears, mouth and genitals (private parts). Wash Face and genitals (private parts)  with your normal soap.   Wash thoroughly, paying special attention to the area where your surgery will be performed.  Thoroughly rinse your body with warm water from the neck down.  DO NOT shower/wash with your normal soap after using and rinsing off the CHG Soap.  Pat yourself dry with a CLEAN TOWEL.  Wear CLEAN PAJAMAS to bed the night before surgery  Place CLEAN SHEETS on your bed the night before your  surgery  DO NOT SLEEP WITH PETS.   Day of Surgery:  Take a shower with CHG soap. Wear Clean/Comfortable clothing the morning of surgery Do not apply any deodorants/lotions.   Remember to brush your teeth WITH YOUR REGULAR TOOTHPASTE.    If you received a COVID test during your pre-op visit, it is requested that you wear a mask when out in public, stay away from anyone that may not be feeling well, and notify your surgeon if you develop symptoms. If you have been in contact with anyone that has tested positive in the last 10 days, please notify your surgeon.    Please read over the following fact sheets that you were given.

## 2022-04-07 ENCOUNTER — Encounter (HOSPITAL_COMMUNITY)
Admission: RE | Admit: 2022-04-07 | Discharge: 2022-04-07 | Disposition: A | Discharge status: Home / Self Care | Payer: BC Managed Care – PPO | Source: Ambulatory Visit | Attending: Specialist | Admitting: Specialist

## 2022-04-07 ENCOUNTER — Encounter (HOSPITAL_COMMUNITY): Payer: Self-pay

## 2022-04-07 ENCOUNTER — Other Ambulatory Visit: Payer: Self-pay

## 2022-04-07 ENCOUNTER — Ambulatory Visit (HOSPITAL_COMMUNITY)
Admission: RE | Admit: 2022-04-07 | Discharge: 2022-04-07 | Disposition: A | Discharge status: Home / Self Care | Payer: BC Managed Care – PPO | Source: Ambulatory Visit | Attending: Orthopedic Surgery | Admitting: Orthopedic Surgery

## 2022-04-07 VITALS — BP 154/92 | HR 101 | Temp 98.4°F | Resp 19 | Ht 72.0 in | Wt 241.0 lb

## 2022-04-07 DIAGNOSIS — E119 Type 2 diabetes mellitus without complications: Secondary | ICD-10-CM

## 2022-04-07 DIAGNOSIS — M5126 Other intervertebral disc displacement, lumbar region: Secondary | ICD-10-CM | POA: Diagnosis not present

## 2022-04-07 DIAGNOSIS — Z01818 Encounter for other preprocedural examination: Secondary | ICD-10-CM

## 2022-04-07 DIAGNOSIS — M48062 Spinal stenosis, lumbar region with neurogenic claudication: Secondary | ICD-10-CM

## 2022-04-07 HISTORY — DX: Type 2 diabetes mellitus without complications: E11.9

## 2022-04-07 LAB — CBC
HCT: 46.4 % (ref 39.0–52.0)
Hemoglobin: 16.1 g/dL (ref 13.0–17.0)
MCH: 30.5 pg (ref 26.0–34.0)
MCHC: 34.7 g/dL (ref 30.0–36.0)
MCV: 87.9 fL (ref 80.0–100.0)
Platelets: 198 10*3/uL (ref 150–400)
RBC: 5.28 MIL/uL (ref 4.22–5.81)
RDW: 13.6 % (ref 11.5–15.5)
WBC: 8.8 10*3/uL (ref 4.0–10.5)
nRBC: 0 % (ref 0.0–0.2)

## 2022-04-07 LAB — BASIC METABOLIC PANEL
Anion gap: 10 (ref 5–15)
BUN: 17 mg/dL (ref 6–20)
CO2: 24 mmol/L (ref 22–32)
Calcium: 10.3 mg/dL (ref 8.9–10.3)
Chloride: 105 mmol/L (ref 98–111)
Creatinine, Ser: 1.08 mg/dL (ref 0.61–1.24)
GFR, Estimated: >60 mL/min (ref >60)
Glucose, Bld: 165 mg/dL — ABNORMAL HIGH (ref 70–99)
Potassium: 4.6 mmol/L (ref 3.5–5.1)
Sodium: 139 mmol/L (ref 135–145)

## 2022-04-07 LAB — SURGICAL PCR SCREEN
MRSA, PCR: NEGATIVE
Staphylococcus aureus: POSITIVE — AB

## 2022-04-07 LAB — GLUCOSE, CAPILLARY: Glucose-Capillary: 182 mg/dL — ABNORMAL HIGH (ref 70–99)

## 2022-04-07 LAB — HEMOGLOBIN A1C
Hgb A1c MFr Bld: 7.3 % — ABNORMAL HIGH (ref 4.8–5.6)
Mean Plasma Glucose: 162.81 mg/dL

## 2022-04-07 NOTE — Progress Notes (Signed)
PCP - Dr. Assunta Found Cardiologist - denies  PPM/ICD - denies   Chest x-ray - denies EKG - 04/07/22 Stress Test - denies ECHO - denies Cardiac Cath - denies  Sleep Study - denies  DM- Type 2 Pt does not check CBG at home  ASA/Blood Thinner Instructions: n/a   ERAS Protcol - yes PRE-SURGERY Ensure given at PAT  COVID TEST- n/a   Anesthesia review: no  Patient denies shortness of breath, fever, cough and chest pain at PAT appointment   All instructions explained to the patient, with a verbal understanding of the material. Patient agrees to go over the instructions while at home for a better understanding. The opportunity to ask questions was provided.

## 2022-04-17 ENCOUNTER — Ambulatory Visit (HOSPITAL_COMMUNITY)
Admission: RE | Admit: 2022-04-17 | Discharge: 2022-04-17 | Disposition: A | Discharge status: Home / Self Care | Payer: BC Managed Care – PPO | Attending: Specialist | Admitting: Specialist

## 2022-04-17 ENCOUNTER — Encounter (HOSPITAL_COMMUNITY): Payer: Self-pay | Admitting: Specialist

## 2022-04-17 ENCOUNTER — Ambulatory Visit (HOSPITAL_COMMUNITY): Payer: BC Managed Care – PPO

## 2022-04-17 ENCOUNTER — Other Ambulatory Visit: Payer: Self-pay

## 2022-04-17 ENCOUNTER — Encounter (HOSPITAL_COMMUNITY)
Admission: RE | Disposition: A | Discharge status: Home / Self Care | Payer: Self-pay | Source: Home / Self Care | Attending: Specialist

## 2022-04-17 ENCOUNTER — Ambulatory Visit (HOSPITAL_COMMUNITY): Payer: BC Managed Care – PPO | Admitting: Certified Registered Nurse Anesthetist

## 2022-04-17 DIAGNOSIS — E119 Type 2 diabetes mellitus without complications: Secondary | ICD-10-CM | POA: Insufficient documentation

## 2022-04-17 DIAGNOSIS — M7138 Other bursal cyst, other site: Secondary | ICD-10-CM | POA: Insufficient documentation

## 2022-04-17 DIAGNOSIS — M48061 Spinal stenosis, lumbar region without neurogenic claudication: Secondary | ICD-10-CM | POA: Diagnosis present

## 2022-04-17 DIAGNOSIS — Z7984 Long term (current) use of oral hypoglycemic drugs: Secondary | ICD-10-CM | POA: Insufficient documentation

## 2022-04-17 DIAGNOSIS — I10 Essential (primary) hypertension: Secondary | ICD-10-CM | POA: Insufficient documentation

## 2022-04-17 HISTORY — PX: HEMI-MICRODISCECTOMY LUMBAR LAMINECTOMY LEVEL 1: SHX5846

## 2022-04-17 LAB — GLUCOSE, CAPILLARY
Glucose-Capillary: 255 mg/dL — ABNORMAL HIGH (ref 70–99)
Glucose-Capillary: 165 mg/dL — ABNORMAL HIGH (ref 70–99)
Glucose-Capillary: 124 mg/dL — ABNORMAL HIGH (ref 70–99)

## 2022-04-17 SURGERY — HEMI-MICRODISCECTOMY LUMBAR LAMINECTOMY LEVEL 1
Anesthesia: General | Laterality: Left

## 2022-04-17 MED ORDER — THROMBIN 20000 UNITS EX SOLR
CUTANEOUS | Status: DC | PRN
  Administered 2022-04-17: 20 mL via TOPICAL

## 2022-04-17 MED ORDER — PROPOFOL 10 MG/ML IV BOLUS
INTRAVENOUS | Status: AC
  Filled 2022-04-17: qty 20

## 2022-04-17 MED ORDER — DOCUSATE SODIUM 100 MG PO CAPS: 100.0000 mg | ORAL_CAPSULE | Freq: Two times a day (BID) | ORAL | 1 refills | Status: AC | PRN

## 2022-04-17 MED ORDER — LIDOCAINE 2% (20 MG/ML) 5 ML SYRINGE
INTRAMUSCULAR | Status: DC | PRN
  Administered 2022-04-17: 60 mg via INTRAVENOUS

## 2022-04-17 MED ORDER — ACETAMINOPHEN 10 MG/ML IV SOLN
1000.0000 mg | INTRAVENOUS | Status: AC
  Administered 2022-04-17: 1000 mg via INTRAVENOUS

## 2022-04-17 MED ORDER — DOCUSATE SODIUM 100 MG PO CAPS: 100.0000 mg | ORAL_CAPSULE | Freq: Two times a day (BID) | ORAL | Status: DC

## 2022-04-17 MED ORDER — DEXMEDETOMIDINE HCL IN NACL 200 MCG/50ML IV SOLN
INTRAVENOUS | Status: DC | PRN
  Administered 2022-04-17: 12 ug via INTRAVENOUS
  Administered 2022-04-17: 20 ug via INTRAVENOUS

## 2022-04-17 MED ORDER — PROPOFOL 10 MG/ML IV BOLUS
INTRAVENOUS | Status: DC | PRN
  Administered 2022-04-17: 200 mg via INTRAVENOUS
  Administered 2022-04-17: 30 mg via INTRAVENOUS

## 2022-04-17 MED ORDER — DEXAMETHASONE SODIUM PHOSPHATE 10 MG/ML IJ SOLN
INTRAMUSCULAR | Status: DC | PRN
  Administered 2022-04-17: 5 mg via INTRAVENOUS

## 2022-04-17 MED ORDER — FENTANYL CITRATE (PF) 250 MCG/5ML IJ SOLN
INTRAMUSCULAR | Status: DC | PRN
  Administered 2022-04-17: 50 ug via INTRAVENOUS
  Administered 2022-04-17: 100 ug via INTRAVENOUS
  Administered 2022-04-17: 50 ug via INTRAVENOUS

## 2022-04-17 MED ORDER — SCOPOLAMINE 1 MG/3DAYS TD PT72
MEDICATED_PATCH | TRANSDERMAL | Status: DC | PRN
  Administered 2022-04-17: 1 via TRANSDERMAL

## 2022-04-17 MED ORDER — ACETAMINOPHEN 650 MG RE SUPP: 650.0000 mg | RECTAL | Status: DC | PRN

## 2022-04-17 MED ORDER — LACTATED RINGERS IV SOLN: INTRAVENOUS | Status: DC

## 2022-04-17 MED ORDER — ALUM & MAG HYDROXIDE-SIMETH 200-200-20 MG/5ML PO SUSP: 30.0000 mL | Freq: Four times a day (QID) | ORAL | Status: DC | PRN

## 2022-04-17 MED ORDER — CHLORHEXIDINE GLUCONATE 0.12 % MT SOLN: 15.0000 mL | Freq: Once | OROMUCOSAL | Status: AC

## 2022-04-17 MED ORDER — PHENOL 1.4 % MT LIQD: 1.0000 | OROMUCOSAL | Status: DC | PRN

## 2022-04-17 MED ORDER — ONDANSETRON HCL 4 MG/2ML IJ SOLN
INTRAMUSCULAR | Status: DC | PRN
  Administered 2022-04-17: 4 mg via INTRAVENOUS

## 2022-04-17 MED ORDER — HYDROMORPHONE HCL 1 MG/ML IJ SOLN: 1.0000 mg | INTRAMUSCULAR | Status: DC | PRN

## 2022-04-17 MED ORDER — THROMBIN 20000 UNITS EX SOLR
CUTANEOUS | Status: AC
  Filled 2022-04-17: qty 20000

## 2022-04-17 MED ORDER — ONDANSETRON HCL 4 MG/2ML IJ SOLN
INTRAMUSCULAR | Status: AC
  Filled 2022-04-17: qty 2

## 2022-04-17 MED ORDER — ACETAMINOPHEN 500 MG PO TABS
1000.0000 mg | ORAL_TABLET | Freq: Four times a day (QID) | ORAL | Status: DC
  Administered 2022-04-17: 1000 mg via ORAL
  Filled 2022-04-17: qty 2

## 2022-04-17 MED ORDER — FENTANYL CITRATE (PF) 100 MCG/2ML IJ SOLN: 25.0000 ug | INTRAMUSCULAR | Status: DC | PRN

## 2022-04-17 MED ORDER — ONDANSETRON HCL 4 MG PO TABS: 4.0000 mg | ORAL_TABLET | Freq: Four times a day (QID) | ORAL | Status: DC | PRN

## 2022-04-17 MED ORDER — PHENYLEPHRINE HCL-NACL 20-0.9 MG/250ML-% IV SOLN
INTRAVENOUS | Status: DC | PRN
  Administered 2022-04-17: 25 ug/min via INTRAVENOUS

## 2022-04-17 MED ORDER — PHENYLEPHRINE 80 MCG/ML (10ML) SYRINGE FOR IV PUSH (FOR BLOOD PRESSURE SUPPORT)
PREFILLED_SYRINGE | INTRAVENOUS | Status: AC
  Filled 2022-04-17: qty 10

## 2022-04-17 MED ORDER — CHLORHEXIDINE GLUCONATE 0.12 % MT SOLN
OROMUCOSAL | Status: AC
  Administered 2022-04-17: 15 mL via OROMUCOSAL
  Filled 2022-04-17: qty 15

## 2022-04-17 MED ORDER — POLYETHYLENE GLYCOL 3350 17 G PO PACK: 17.0000 g | PACK | Freq: Every day | ORAL | 0 refills | Status: AC

## 2022-04-17 MED ORDER — ROCURONIUM BROMIDE 10 MG/ML (PF) SYRINGE
PREFILLED_SYRINGE | INTRAVENOUS | Status: DC | PRN
  Administered 2022-04-17: 70 mg via INTRAVENOUS
  Administered 2022-04-17: 20 mg via INTRAVENOUS

## 2022-04-17 MED ORDER — BUPIVACAINE-EPINEPHRINE (PF) 0.5% -1:200000 IJ SOLN
INTRAMUSCULAR | Status: AC
  Filled 2022-04-17: qty 30

## 2022-04-17 MED ORDER — TRANEXAMIC ACID-NACL 1000-0.7 MG/100ML-% IV SOLN
INTRAVENOUS | Status: AC
  Filled 2022-04-17: qty 100

## 2022-04-17 MED ORDER — AMISULPRIDE (ANTIEMETIC) 5 MG/2ML IV SOLN: 10.0000 mg | Freq: Once | INTRAVENOUS | Status: DC | PRN

## 2022-04-17 MED ORDER — ONDANSETRON HCL 4 MG/2ML IJ SOLN: 4.0000 mg | Freq: Four times a day (QID) | INTRAMUSCULAR | Status: DC | PRN

## 2022-04-17 MED ORDER — ACETAMINOPHEN 10 MG/ML IV SOLN
INTRAVENOUS | Status: AC
  Filled 2022-04-17: qty 100

## 2022-04-17 MED ORDER — ACETAMINOPHEN 500 MG PO TABS: 1000.0000 mg | ORAL_TABLET | Freq: Once | ORAL | Status: DC

## 2022-04-17 MED ORDER — LIDOCAINE 2% (20 MG/ML) 5 ML SYRINGE
INTRAMUSCULAR | Status: AC
  Filled 2022-04-17: qty 5

## 2022-04-17 MED ORDER — CEFAZOLIN SODIUM-DEXTROSE 2-4 GM/100ML-% IV SOLN
2.0000 g | INTRAVENOUS | Status: AC
  Administered 2022-04-17: 2 g via INTRAVENOUS

## 2022-04-17 MED ORDER — MIDAZOLAM HCL 2 MG/2ML IJ SOLN
INTRAMUSCULAR | Status: AC
  Filled 2022-04-17: qty 2

## 2022-04-17 MED ORDER — POTASSIUM CHLORIDE IN NACL 20-0.45 MEQ/L-% IV SOLN
INTRAVENOUS | Status: DC
  Filled 2022-04-17: qty 1000

## 2022-04-17 MED ORDER — EPHEDRINE 5 MG/ML INJ
INTRAVENOUS | Status: AC
  Filled 2022-04-17: qty 5

## 2022-04-17 MED ORDER — SUGAMMADEX SODIUM 200 MG/2ML IV SOLN
INTRAVENOUS | Status: DC | PRN
  Administered 2022-04-17: 300 mg via INTRAVENOUS

## 2022-04-17 MED ORDER — DEXAMETHASONE SODIUM PHOSPHATE 10 MG/ML IJ SOLN
INTRAMUSCULAR | Status: AC
  Filled 2022-04-17: qty 1

## 2022-04-17 MED ORDER — 0.9 % SODIUM CHLORIDE (POUR BTL) OPTIME
TOPICAL | Status: DC | PRN
  Administered 2022-04-17: 1000 mL

## 2022-04-17 MED ORDER — INSULIN ASPART 100 UNIT/ML IJ SOLN: 0.0000 [IU] | Freq: Three times a day (TID) | INTRAMUSCULAR | Status: DC

## 2022-04-17 MED ORDER — OXYCODONE-ACETAMINOPHEN 10-325 MG PO TABS: 1.0000 | ORAL_TABLET | Freq: Four times a day (QID) | ORAL | 0 refills | Status: AC | PRN

## 2022-04-17 MED ORDER — MAGNESIUM CITRATE PO SOLN
1.0000 | Freq: Once | ORAL | Status: DC | PRN
  Filled 2022-04-17: qty 296

## 2022-04-17 MED ORDER — METHOCARBAMOL 1000 MG/10ML IJ SOLN
500.0000 mg | Freq: Four times a day (QID) | INTRAVENOUS | Status: DC | PRN
  Filled 2022-04-17: qty 5

## 2022-04-17 MED ORDER — PHENYLEPHRINE 80 MCG/ML (10ML) SYRINGE FOR IV PUSH (FOR BLOOD PRESSURE SUPPORT)
PREFILLED_SYRINGE | INTRAVENOUS | Status: DC | PRN
  Administered 2022-04-17: 160 ug via INTRAVENOUS
  Administered 2022-04-17 (×3): 80 ug via INTRAVENOUS

## 2022-04-17 MED ORDER — LINAGLIPTIN 5 MG PO TABS: 5.0000 mg | ORAL_TABLET | Freq: Every day | ORAL | Status: DC

## 2022-04-17 MED ORDER — RISAQUAD PO CAPS
1.0000 | ORAL_CAPSULE | Freq: Every day | ORAL | Status: DC
  Administered 2022-04-17: 1 via ORAL
  Filled 2022-04-17: qty 1

## 2022-04-17 MED ORDER — FENTANYL CITRATE (PF) 250 MCG/5ML IJ SOLN
INTRAMUSCULAR | Status: AC
  Filled 2022-04-17: qty 5

## 2022-04-17 MED ORDER — ACETAMINOPHEN 325 MG PO TABS: 650.0000 mg | ORAL_TABLET | ORAL | Status: DC | PRN

## 2022-04-17 MED ORDER — DEXMEDETOMIDINE HCL IN NACL 80 MCG/20ML IV SOLN
INTRAVENOUS | Status: AC
  Filled 2022-04-17: qty 20

## 2022-04-17 MED ORDER — MIDAZOLAM HCL 5 MG/5ML IJ SOLN
INTRAMUSCULAR | Status: DC | PRN
  Administered 2022-04-17: 2 mg via INTRAVENOUS

## 2022-04-17 MED ORDER — EPHEDRINE SULFATE-NACL 50-0.9 MG/10ML-% IV SOSY
PREFILLED_SYRINGE | INTRAVENOUS | Status: DC | PRN
  Administered 2022-04-17: 5 mg via INTRAVENOUS

## 2022-04-17 MED ORDER — METHOCARBAMOL 500 MG PO TABS: 500.0000 mg | ORAL_TABLET | Freq: Four times a day (QID) | ORAL | Status: DC | PRN

## 2022-04-17 MED ORDER — BISACODYL 5 MG PO TBEC: 5.0000 mg | DELAYED_RELEASE_TABLET | Freq: Every day | ORAL | Status: DC | PRN

## 2022-04-17 MED ORDER — GABAPENTIN 300 MG PO CAPS: 300.0000 mg | ORAL_CAPSULE | Freq: Three times a day (TID) | ORAL | Status: DC

## 2022-04-17 MED ORDER — CEFAZOLIN SODIUM-DEXTROSE 2-4 GM/100ML-% IV SOLN
INTRAVENOUS | Status: AC
  Filled 2022-04-17: qty 100

## 2022-04-17 MED ORDER — ROCURONIUM BROMIDE 10 MG/ML (PF) SYRINGE
PREFILLED_SYRINGE | INTRAVENOUS | Status: AC
  Filled 2022-04-17: qty 10

## 2022-04-17 MED ORDER — POLYETHYLENE GLYCOL 3350 17 G PO PACK: 17.0000 g | PACK | Freq: Every day | ORAL | Status: DC | PRN

## 2022-04-17 MED ORDER — OXYCODONE HCL 5 MG PO TABS: 10.0000 mg | ORAL_TABLET | ORAL | Status: DC | PRN

## 2022-04-17 MED ORDER — MENTHOL 3 MG MT LOZG: 1.0000 | LOZENGE | OROMUCOSAL | Status: DC | PRN

## 2022-04-17 MED ORDER — IRBESARTAN 150 MG PO TABS: 300.0000 mg | ORAL_TABLET | Freq: Every day | ORAL | Status: DC

## 2022-04-17 MED ORDER — METHOCARBAMOL 750 MG PO TABS: 750.0000 mg | ORAL_TABLET | Freq: Three times a day (TID) | ORAL | 1 refills | Status: AC | PRN

## 2022-04-17 MED ORDER — ORAL CARE MOUTH RINSE: 15.0000 mL | Freq: Once | OROMUCOSAL | Status: AC

## 2022-04-17 MED ORDER — CEFAZOLIN SODIUM-DEXTROSE 2-4 GM/100ML-% IV SOLN: 2.0000 g | Freq: Three times a day (TID) | INTRAVENOUS | Status: DC

## 2022-04-17 MED ORDER — BUPIVACAINE-EPINEPHRINE 0.5% -1:200000 IJ SOLN
INTRAMUSCULAR | Status: DC | PRN
  Administered 2022-04-17: 2 mL

## 2022-04-17 MED ORDER — LEVOTHYROXINE SODIUM 75 MCG PO TABS: 75.0000 ug | ORAL_TABLET | Freq: Every day | ORAL | Status: DC

## 2022-04-17 MED ORDER — INSULIN ASPART 100 UNIT/ML IJ SOLN
0.0000 [IU] | INTRAMUSCULAR | Status: AC | PRN
  Administered 2022-04-17: 6 [IU] via SUBCUTANEOUS
  Administered 2022-04-17: 2 [IU] via SUBCUTANEOUS
  Filled 2022-04-17 (×2): qty 1

## 2022-04-17 MED ORDER — TRANEXAMIC ACID-NACL 1000-0.7 MG/100ML-% IV SOLN
1000.0000 mg | INTRAVENOUS | Status: AC
  Administered 2022-04-17: 1000 mg via INTRAVENOUS

## 2022-04-17 SURGICAL SUPPLY — 66 items
BAG COUNTER SPONGE SURGICOUNT (BAG) ×2 IMPLANT
BAG DECANTER FOR FLEXI CONT (MISCELLANEOUS) IMPLANT
BAG SPNG CNTER NS LX DISP (BAG) ×1
BAND INSRT 18 STRL LF DISP RB (MISCELLANEOUS) ×2
BAND RUBBER #18 3X1/16 STRL (MISCELLANEOUS) ×4 IMPLANT
BUR EGG ELITE 5.0 (BURR) IMPLANT
BUR RND DIAMOND ELITE 4.0 (BURR) IMPLANT
BUR STRYKR EGG 5.0 (BURR) IMPLANT
CARTRIDGE OIL MAESTRO DRILL (MISCELLANEOUS) IMPLANT
CLEANER TIP ELECTROSURG 2X2 (MISCELLANEOUS) ×2 IMPLANT
CNTNR URN SCR LID CUP LEK RST (MISCELLANEOUS) ×2 IMPLANT
CONT SPEC 4OZ STRL OR WHT (MISCELLANEOUS) ×1
DIFFUSER DRILL AIR PNEUMATIC (MISCELLANEOUS) IMPLANT
DRAPE LAPAROTOMY 100X72X124 (DRAPES) ×2 IMPLANT
DRAPE MICROSCOPE SLANT 54X150 (MISCELLANEOUS) ×2 IMPLANT
DRAPE SHEET LG 3/4 BI-LAMINATE (DRAPES) ×2 IMPLANT
DRAPE SURG 17X11 SM STRL (DRAPES) ×2 IMPLANT
DRAPE UTILITY XL STRL (DRAPES) ×2 IMPLANT
DRSG AQUACEL AG ADV 3.5X 4 (GAUZE/BANDAGES/DRESSINGS) IMPLANT
DRSG AQUACEL AG ADV 3.5X 6 (GAUZE/BANDAGES/DRESSINGS) IMPLANT
DRSG TELFA 3X8 NADH STRL (GAUZE/BANDAGES/DRESSINGS) IMPLANT
DURAPREP 26ML APPLICATOR (WOUND CARE) ×2 IMPLANT
DURASEAL SPINE SEALANT 3ML (MISCELLANEOUS) IMPLANT
ELECT BLADE 4.0 EZ CLEAN MEGAD (MISCELLANEOUS)
ELECT REM PT RETURN 9FT ADLT (ELECTROSURGICAL) ×1
ELECTRODE BLDE 4.0 EZ CLN MEGD (MISCELLANEOUS) IMPLANT
ELECTRODE REM PT RTRN 9FT ADLT (ELECTROSURGICAL) ×2 IMPLANT
GLOVE BIOGEL PI IND STRL 7.5 (GLOVE) ×2 IMPLANT
GLOVE SURG SS PI 7.0 STRL IVOR (GLOVE) ×2 IMPLANT
GLOVE SURG SS PI 7.5 STRL IVOR (GLOVE) IMPLANT
GLOVE SURG SS PI 8.0 STRL IVOR (GLOVE) ×4 IMPLANT
GOWN STRL REUS W/ TWL LRG LVL3 (GOWN DISPOSABLE) ×2 IMPLANT
GOWN STRL REUS W/ TWL XL LVL3 (GOWN DISPOSABLE) ×2 IMPLANT
GOWN STRL REUS W/TWL LRG LVL3 (GOWN DISPOSABLE) ×1
GOWN STRL REUS W/TWL XL LVL3 (GOWN DISPOSABLE) ×3
IV CATH 14GX2 1/4 (CATHETERS) ×2 IMPLANT
KIT BASIN OR (CUSTOM PROCEDURE TRAY) ×2 IMPLANT
NDL 22X1.5 STRL (OR ONLY) (MISCELLANEOUS) ×2 IMPLANT
NDL SPNL 18GX3.5 QUINCKE PK (NEEDLE) ×4 IMPLANT
NEEDLE 22X1.5 STRL (OR ONLY) (MISCELLANEOUS) ×1 IMPLANT
NEEDLE HYPO 22GX1.5 SAFETY (NEEDLE) IMPLANT
NEEDLE SPNL 18GX3.5 QUINCKE PK (NEEDLE) ×2 IMPLANT
OIL CARTRIDGE MAESTRO DRILL (MISCELLANEOUS)
PACK LAMINECTOMY NEURO (CUSTOM PROCEDURE TRAY) ×2 IMPLANT
PATTIES SURGICAL .75X.75 (GAUZE/BANDAGES/DRESSINGS) ×2 IMPLANT
SOLUTION PRONTOSAN WOUND 350ML (IRRIGATION / IRRIGATOR) IMPLANT
SPONGE SURGIFOAM ABS GEL 100 (HEMOSTASIS) ×2 IMPLANT
SPONGE T-LAP 4X18 ~~LOC~~+RFID (SPONGE) IMPLANT
STAPLER VISISTAT (STAPLE) IMPLANT
STRIP CLOSURE SKIN 1/2X4 (GAUZE/BANDAGES/DRESSINGS) ×2 IMPLANT
SUT NURALON 4 0 TR CR/8 (SUTURE) IMPLANT
SUT PROLENE 3 0 PS 2 (SUTURE) IMPLANT
SUT VIC AB 1 CT1 27 (SUTURE) ×1
SUT VIC AB 1 CT1 27XBRD ANTBC (SUTURE) IMPLANT
SUT VIC AB 1-0 CT2 27 (SUTURE) IMPLANT
SUT VIC AB 2-0 CT1 27 (SUTURE)
SUT VIC AB 2-0 CT1 TAPERPNT 27 (SUTURE) IMPLANT
SUT VIC AB 2-0 CT2 27 (SUTURE) IMPLANT
SYR 3ML LL SCALE MARK (SYRINGE) ×2 IMPLANT
TAPE CLOTH SURG 4X10 WHT LF (GAUZE/BANDAGES/DRESSINGS) IMPLANT
TOWEL GREEN STERILE (TOWEL DISPOSABLE) ×2 IMPLANT
TOWEL GREEN STERILE FF (TOWEL DISPOSABLE) ×2 IMPLANT
TRAY FOLEY MTR SLVR 14FR STAT (SET/KITS/TRAYS/PACK) IMPLANT
TRAY FOLEY MTR SLVR 16FR STAT (SET/KITS/TRAYS/PACK) ×2 IMPLANT
WIPE CHG 2% PREP (PERSONAL CARE ITEMS) ×2 IMPLANT
YANKAUER SUCT BULB TIP NO VENT (SUCTIONS) ×2 IMPLANT

## 2022-04-17 NOTE — Anesthesia Postprocedure Evaluation (Signed)
Anesthesia Post Note  Patient: Curtis Sanchez  Procedure(s) Performed: Hemi laminotomy and excision of synovial cyst Lumbar  Four-Five Left (Left)     Patient location during evaluation: PACU Anesthesia Type: General Level of consciousness: awake and alert Pain management: pain level controlled Vital Signs Assessment: post-procedure vital signs reviewed and stable Respiratory status: spontaneous breathing, nonlabored ventilation, respiratory function stable and patient connected to nasal cannula oxygen Cardiovascular status: blood pressure returned to baseline and stable Postop Assessment: no apparent nausea or vomiting Anesthetic complications: no   No notable events documented.  Last Vitals:  Vitals:   04/17/22 1700 04/17/22 1717  BP: (!) 160/92 (!) 169/91  Pulse: 79 85  Resp: 12 18  Temp: 36.4 C 36.7 C  SpO2: 95% 94%    Last Pain:  Vitals:   04/17/22 1717  TempSrc: Oral  PainSc:                  Tiajuana Amass

## 2022-04-17 NOTE — Progress Notes (Signed)
Patient alert and oriented, mae's well, voiding adequate amount of urine, swallowing without difficulty, no c/o pain at time of discharge. Patient discharged home with family. Script and discharged instructions given to patient. Patient and family stated understanding of instructions given. Patient has an appointment with Dr. Beane in 2 weeks 

## 2022-04-17 NOTE — Progress Notes (Signed)
Foley catheter removed  at this time patient tolerated procedure

## 2022-04-17 NOTE — Anesthesia Preprocedure Evaluation (Signed)
Anesthesia Evaluation  Patient identified by MRN, date of birth, ID band Patient awake    Reviewed: Allergy & Precautions, NPO status , Patient's Chart, lab work & pertinent test results  History of Anesthesia Complications (+) PONV and history of anesthetic complications  Airway Mallampati: III  TM Distance: >3 FB Neck ROM: Full    Dental  (+) Dental Advisory Given   Pulmonary neg pulmonary ROS,    breath sounds clear to auscultation       Cardiovascular hypertension, Pt. on medications  Rhythm:Regular Rate:Normal     Neuro/Psych negative neurological ROS     GI/Hepatic negative GI ROS, Neg liver ROS,   Endo/Other  diabetes, Type 2, Oral Hypoglycemic Agents  Renal/GU negative Renal ROS     Musculoskeletal   Abdominal   Peds  Hematology negative hematology ROS (+)   Anesthesia Other Findings   Reproductive/Obstetrics                             Anesthesia Physical Anesthesia Plan  ASA: 2  Anesthesia Plan: General   Post-op Pain Management: Tylenol PO (pre-op)* and Toradol IV (intra-op)*   Induction: Intravenous  PONV Risk Score and Plan: 3 and Dexamethasone, Ondansetron, Midazolam and Treatment may vary due to age or medical condition  Airway Management Planned: Oral ETT  Additional Equipment: None  Intra-op Plan:   Post-operative Plan: Extubation in OR  Informed Consent: I have reviewed the patients History and Physical, chart, labs and discussed the procedure including the risks, benefits and alternatives for the proposed anesthesia with the patient or authorized representative who has indicated his/her understanding and acceptance.     Dental advisory given  Plan Discussed with: CRNA  Anesthesia Plan Comments:         Anesthesia Quick Evaluation

## 2022-04-17 NOTE — Anesthesia Procedure Notes (Signed)
Procedure Name: Intubation Date/Time: 04/17/2022 1:38 PM  Performed by: Colin Benton, CRNAPre-anesthesia Checklist: Patient identified, Emergency Drugs available, Suction available and Patient being monitored Patient Re-evaluated:Patient Re-evaluated prior to induction Oxygen Delivery Method: Circle system utilized Preoxygenation: Pre-oxygenation with 100% oxygen Induction Type: IV induction Ventilation: Mask ventilation without difficulty and Oral airway inserted - appropriate to patient size Laryngoscope Size: Glidescope and 4 Grade View: Grade I Tube type: Oral Tube size: 7.5 mm Number of attempts: 1 Airway Equipment and Method: Rigid stylet and Video-laryngoscopy Placement Confirmation: ETT inserted through vocal cords under direct vision, positive ETCO2 and breath sounds checked- equal and bilateral Secured at: 23 cm Tube secured with: Tape Dental Injury: Teeth and Oropharynx as per pre-operative assessment  Comments: Patient with large cap on front tooth.  Elective glidescope intubation.

## 2022-04-17 NOTE — Brief Op Note (Signed)
04/17/2022  1:15 PM  PATIENT:  Curtis Sanchez  61 y.o. 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  POST-OPERATIVE DIAGNOSIS:  * No post-op diagnosis entered *  PROCEDURE:  Procedure(s) with comments: Hemi laminotomy and excision of synovial cyst L4-5 Left (Left) - 2 hrs 3 C-Bed  SURGEON:  Surgeon(s) and Role:    Susa Day, MD - Primary  PHYSICIAN ASSISTANT:   ASSISTANTS: Bissell   ANESTHESIA:   general  EBL:  min   BLOOD ADMINISTERED:none  DRAINS: none   LOCAL MEDICATIONS USED:  MARCAINE     SPECIMEN:  No Specimen  DISPOSITION OF SPECIMEN:  N/A  COUNTS:  YES  TOURNIQUET:  * No tourniquets in log *  DICTATION: .Other Dictation: Dictation Number [id number]  PLAN OF CARE: Admit for overnight observation  PATIENT DISPOSITION:  PACU - hemodynamically stable.   Delay start of Pharmacological VTE agent (>24hrs) due to surgical blood loss or risk of bleeding: yes

## 2022-04-17 NOTE — Transfer of Care (Signed)
Immediate Anesthesia Transfer of Care Note  Patient: Curtis Sanchez  Procedure(s) Performed: Hemi laminotomy and excision of synovial cyst Lumbar  Four-Five Left (Left)  Patient Location: PACU  Anesthesia Type:General  Level of Consciousness: awake, alert  and patient cooperative  Airway & Oxygen Therapy: Patient Spontanous Breathing and Patient connected to nasal cannula oxygen  Post-op Assessment: Report given to RN and Post -op Vital signs reviewed and stable  Post vital signs: Reviewed and stable  Last Vitals:  Vitals Value Taken Time  BP 177/87 04/17/22 1542  Temp    Pulse 83 04/17/22 1544  Resp 18 04/17/22 1544  SpO2 96 % 04/17/22 1544  Vitals shown include unvalidated device data.  Last Pain:  Vitals:   04/17/22 1024  PainSc: 2          Complications: No notable events documented.

## 2022-04-17 NOTE — Plan of Care (Signed)
  Problem: Education: Goal: Ability to describe self-care measures that may prevent or decrease complications (Diabetes Survival Skills Education) will improve Outcome: Completed/Met Goal: Individualized Educational Video(s) Outcome: Completed/Met   Problem: Coping: Goal: Ability to adjust to condition or change in health will improve Outcome: Completed/Met   Problem: Fluid Volume: Goal: Ability to maintain a balanced intake and output will improve Outcome: Completed/Met   Problem: Health Behavior/Discharge Planning: Goal: Ability to identify and utilize available resources and services will improve Outcome: Completed/Met Goal: Ability to manage health-related needs will improve Outcome: Completed/Met   Problem: Metabolic: Goal: Ability to maintain appropriate glucose levels will improve Outcome: Completed/Met   Problem: Nutritional: Goal: Maintenance of adequate nutrition will improve Outcome: Completed/Met Goal: Progress toward achieving an optimal weight will improve Outcome: Completed/Met   Problem: Skin Integrity: Goal: Risk for impaired skin integrity will decrease Outcome: Completed/Met   Problem: Tissue Perfusion: Goal: Adequacy of tissue perfusion will improve Outcome: Completed/Met   Problem: Education: Goal: Ability to verbalize activity precautions or restrictions will improve Outcome: Completed/Met Goal: Knowledge of the prescribed therapeutic regimen will improve Outcome: Completed/Met Goal: Understanding of discharge needs will improve Outcome: Completed/Met   Problem: Activity: Goal: Ability to avoid complications of mobility impairment will improve Outcome: Completed/Met Goal: Ability to tolerate increased activity will improve Outcome: Completed/Met Goal: Will remain free from falls Outcome: Completed/Met   Problem: Bowel/Gastric: Goal: Gastrointestinal status for postoperative course will improve Outcome: Completed/Met   Problem: Clinical  Measurements: Goal: Ability to maintain clinical measurements within normal limits will improve Outcome: Completed/Met Goal: Postoperative complications will be avoided or minimized Outcome: Completed/Met Goal: Diagnostic test results will improve Outcome: Completed/Met   Problem: Pain Management: Goal: Pain level will decrease Outcome: Completed/Met   Problem: Skin Integrity: Goal: Will show signs of wound healing Outcome: Completed/Met   Problem: Health Behavior/Discharge Planning: Goal: Identification of resources available to assist in meeting health care needs will improve Outcome: Completed/Met   Problem: Bladder/Genitourinary: Goal: Urinary functional status for postoperative course will improve Outcome: Completed/Met

## 2022-04-17 NOTE — Interval H&P Note (Signed)
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  The various methods of treatment have been discussed with the patient and family. After consideration of risks, benefits and other options for treatment, the patient has consented to  Procedure(s) with comments: Hemi laminotomy and excision of synovial cyst L4-5 Left (Left) - 2 hrs 3 C-Bed as a surgical intervention.  The patient's history has been reviewed, patient examined, no change in status, stable for surgery.  I have reviewed the patient's chart and labs.  Questions were answered to the patient's satisfaction.     Curtis Sanchez

## 2022-04-17 NOTE — Discharge Instructions (Signed)

## 2022-04-17 NOTE — Op Note (Unsigned)
NAME: MORY, HERRMAN MEDICAL RECORD NO: [id number] ACCOUNT NO: [id number] DATE OF BIRTH: 11-29-1960 FACILITY: [facility] LOCATION: [facility] PHYSICIAN: Javier Docker, MD  Operative Report   DATE OF PROCEDURE: 04/17/2022  PREOPERATIVE DIAGNOSIS:  Spinal stenosis, synovial cyst, L4-L5, left.  POSTOPERATIVE DIAGNOSES:   1.  Spinal stenosis, L4-5. 2.  Synovial cyst, L4-5. 3.  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  PROCEDURE PERFORMED:   1.  Hemilaminotomy and microlumbar decompression, L4-L5, left. 2.  Foraminotomies L4-L5, left. 3.  Lysis of extensive epidural venous plexus. 4.  Excision of synovial cyst.  ANESTHESIA:  General.  ASSISTANT:  Andrez Grime, PA.  HISTORY:  A 61 year old male with L5 radiculopathy secondary to synovial cyst compressing the L5 nerve root in the lateral recess and failure to improve with conservative treatment.  He was indicated for decompression and excision of the synovial cyst to  alleviate pressure on the L5 nerve root.  Risks and benefits discussed including bleeding, infection, damage to neurovascular structures, no change in symptoms, worsening symptoms, DVT, PE, anesthetic complications, etc.  DESCRIPTION OF PROCEDURE:  With the patient in supine position.  After induction of adequate general anesthesia, 2 grams Kefzol placed prone on the Wilson frame.  All bony prominences were well padded.  Foley to gravity.  Lumbar region was prepped and  draped in the usual sterile fashion.  Two 18-gauge spinal needles were utilized to localize L4-L5 interspace, confirmed with x-ray.  Incision was made from the spinous process of L4-L5.  Subcutaneous tissue was dissected.  Electrocautery was utilized to  achieve hemostasis.  0.25% Marcaine with epinephrine was infiltrated in subcutaneous tissue.  Dorsal lumbar fascia divided in line with skin incision.  Paraspinous muscle elevated from lamina of L4-L5 on the left. Operating microscope was draped and  brought in the  surgical field, after McCulloch retractor placed.  Confirmatory radiograph obtained with a Penfield in the interlaminar space.  First a straight curette was utilized to detach ligamentum flavum from the cephalad edge of L4.  Hemilaminotomy  of the caudad edge of 4 was performed with a 3 mm Kerrison.  High-speed bur was utilized to enlarge a hemilaminotomy and to cauterize the bone.  After ligamentum flavum detached from the cephalad edge and identified the superior articulating process of  L4.  I placed a Penfield beneath the ligamentum flavum in the epidural fat protecting the neural elements.  I gently mobilized the 5 root medially.  There was epidural bleeding noted.  Protecting the 5 root I performed a foraminotomy of L5.  I protected  the L5 nerve root.  I placed a neuro patty over the epidural venous plexus, decompressed the lateral recess to the medial border of the pedicle.  I then protecting the nerve root cauterized the epidural venous plexus and divided it.  This was tethering  the L5 nerve root.  I retracted up the superior articulating process.  There was a synovial cyst extending out the superior part of the joint at the point of the cephalad edge of the superior articulating process.  This was excised, and it was consistent  with a synovial cyst.  I had developed a plane between the thecal sac and the cyst prior to its excision without difficulty.  Following this, I performed a foraminotomy of L4.  I mobilized the thecal sac and the nerve root, cauterizing the epidural  venous plexus.  The disk was evaluated and there was no evidence of disk herniation.  Woodson probe passed freely out the foramen of  4 and of 5.  There was 1 cm of excursion of the L5 nerve root medial to the pedicle without tension.  I placed  thrombin-soaked Gelfoam in the laminotomy defect and obtained a confirmatory radiograph with a Penfield at the interlaminar space.  Next copiously irrigated with irrigation.  Inspection  revealed no evidence of CSF leakage or active bleeding.  The nerve  root was free.  Following this, I draped the epidural fat over the L5 nerve root.  I removed the McCulloch retractor, irrigated the paraspinous musculature.  No evidence of active bleeding or CSF leakage. Thrombin-soaked Gelfoam was placed in the  laminotomy defect and evacuated.  The dorsal lumbar fascia reapproximated with #1 Vicryl in interrupted figure-of-eight sutures, subcutaneous with 2-0 and skin with Prolene.  Sterile dressing applied.  Placed supine on the hospital bed, extubated without  difficulty and transported to the recovery room in satisfactory condition.  The patient tolerated the procedure well.  No complication.  ASSISTANT:  Lacie Draft, PA.  BLOOD LOSS:  40 mL.   PUS D: 04/17/2022 3:32:33 pm T: 04/17/2022 8:37:00 pm  JOB: [id number]/ [id number]

## 2022-04-18 ENCOUNTER — Encounter (HOSPITAL_COMMUNITY): Payer: Self-pay | Admitting: Specialist

## 2022-04-20 ENCOUNTER — Other Ambulatory Visit (HOSPITAL_COMMUNITY): Payer: BC Managed Care – PPO
